# Patient Record
Sex: Female | Born: 2000 | Race: Black or African American | Hispanic: No | Marital: Single | Smoking: Never smoker
Health system: Southern US, Community
[De-identification: ages and names within clinical notes are randomized; demographics above are authoritative.]

## PROBLEM LIST (undated history)

## (undated) DIAGNOSIS — L309 Dermatitis, unspecified: Secondary | ICD-10-CM

## (undated) DIAGNOSIS — F99 Mental disorder, not otherwise specified: Secondary | ICD-10-CM

## (undated) HISTORY — DX: Mental disorder, not otherwise specified: F99

## (undated) HISTORY — DX: Dermatitis, unspecified: L30.9

---

## 2021-04-15 ENCOUNTER — Ambulatory Visit (INDEPENDENT_AMBULATORY_CARE_PROVIDER_SITE_OTHER): Payer: Federal, State, Local not specified - PPO | Admitting: Obstetrics & Gynecology

## 2021-04-15 ENCOUNTER — Encounter: Payer: Self-pay | Admitting: Obstetrics & Gynecology

## 2021-04-15 VITALS — BP 127/78 | HR 85 | Ht 62.0 in | Wt 207.0 lb

## 2021-04-15 DIAGNOSIS — N913 Primary oligomenorrhea: Secondary | ICD-10-CM

## 2021-04-15 NOTE — Patient Instructions (Addendum)
Ask Mom about the type of breast cancer? Is is hormone sensitive? Do you need genetic testing? Can you take hormonal pills/birth control pills? Have you had HPV vaccine/Gardasil?   Polycystic Ovary Syndrome Polycystic ovarian syndrome (PCOS) is a common hormonal disorder among women of reproductive age. In most women with PCOS, small fluid-filled sacs (cysts) grow on the ovaries. PCOS can cause problems with menstrual periods and make it hard to get and stay pregnant. If this condition is not treated, it can lead to serious health problems, such as diabetes and heart disease. What are the causes? The cause of this condition is not known. It may be due to certain factors, such as: Irregular menstrual cycle. High levels of certain hormones. Problems with the hormone that helps to control blood sugar (insulin). Certain genes. What increases the risk? You are more likely to develop this condition if you: Have a family history of PCOS or type 2 diabetes. Are overweight, eat unhealthy foods, and are not active. These factors may cause problems with blood sugar control, which can contribute to PCOS or PCOS symptoms. What are the signs or symptoms? Symptoms of this condition include: Ovarian cysts and sometimes pelvic pain. Menstrual periods that are not regular or are too heavy. Inability to get or stay pregnant. Increased growth of hair on the face, chest, stomach, back, thumbs, thighs, or toes. Acne or oily skin. Acne may develop during adulthood, and it may not get better with treatment. Weight gain or obesity. Patches of thickened and dark brown or black skin on the neck, arms, breasts, or thighs. How is this diagnosed? This condition is diagnosed based on: Your medical history. A physical exam that includes a pelvic exam. Your health care provider may look for areas of increased hair growth on your skin. Tests, such as: An ultrasound to check the ovaries for cysts and to view the lining  of the uterus. Blood tests to check levels of sugar (glucose), female hormone (testosterone), and female hormones (estrogen and progesterone). How is this treated? There is no cure for this condition, but treatment can help to manage symptoms and prevent more health problems from developing. Treatment varies depending on your symptoms and if you want to have a baby or if you need birth control. Treatment may include: Making nutrition and lifestyle changes. Taking the progesterone hormone to start a menstrual period. Taking birth control pills to help you have regular menstrual periods. Taking medicines such as: Medicines to make you ovulate, if you want to get pregnant. Medicine to reduce extra hair growth. Having surgery in severe cases. This may involve making small holes in one or both of your ovaries. This decreases the amount of testosterone that your body makes. Follow these instructions at home: Take over-the-counter and prescription medicines only as told by your health care provider. Follow a healthy meal plan that includes lean proteins, complex carbohydrates, fresh fruits and vegetables, low-fat dairy products, healthy fats, and fiber. If you are overweight, lose weight as told by your health care provider. Your health care provider can determine how much weight loss is best for you and can help you lose weight safely. Keep all follow-up visits. This is important. Contact a health care provider if: Your symptoms do not get better with medicine. Your symptoms get worse or you develop new symptoms. Summary Polycystic ovarian syndrome (PCOS) is a common hormonal disorder among women of reproductive age. PCOS can cause problems with menstrual periods and make it hard to get and  stay pregnant. If this condition is not treated, it can lead to serious health problems, such as diabetes and heart disease. There is no cure for this condition, but treatment can help to manage symptoms and  prevent more health problems from developing. This information is not intended to replace advice given to you by your health care provider. Make sure you discuss any questions you have with your health care provider. Document Revised: 11/14/2019 Document Reviewed: 11/14/2019 Elsevier Patient Education  2022 Elsevier Inc.   HPV (Human Papillomavirus) Vaccine: What You Need to Know 1. Why get vaccinated? HPV (human papillomavirus) vaccine can prevent infection with some types of human papillomavirus. HPV infections can cause certain types of cancers, including: cervical, vaginal, and vulvar cancers in women penile cancer in men anal cancers in both men and women cancers of tonsils, base of tongue, and back of throat (oropharyngeal cancer) in both men and women HPV infections can also cause anogenital warts. HPV vaccine can prevent over 90% of cancers caused by HPV. HPV is spread through intimate skin-to-skin or sexual contact. HPV infections are so common that nearly all people will get at least one type of HPV at some time in their lives. Most HPV infections go away on their own within 2 years. But sometimes HPV infections will last longer and can cause cancers later in life. 2. HPV vaccine HPV vaccine is routinely recommended for adolescents at 22 or 19 years of age to ensure they are protected before they are exposed to the virus. HPV vaccine may be given beginning at age 62 years and vaccination is recommended for everyone through 19 years of age. HPV vaccine may be given to adults 27 through 19 years of age, based on discussions between the patient and health care provider. Most children who get the first dose before 74 years of age need 2 doses of HPV vaccine. People who get the first dose at or after 75 years of age and younger people with certain immunocompromising conditions need 3 doses. Your health care provider can give you more information. HPV vaccine may be given at the same time as  other vaccines. 3. Talk with your health care provider Tell your vaccination provider if the person getting the vaccine: Has had an allergic reaction after a previous dose of HPV vaccine, or has any severe, life-threatening allergies Is pregnant--HPV vaccine is not recommended until after pregnancy In some cases, your health care provider may decide to postpone HPV vaccination until a future visit. People with minor illnesses, such as a cold, may be vaccinated. People who are moderately or severely ill should usually wait until they recover before getting HPV vaccine. Your health care provider can give you more information. 4. Risks of a vaccine reaction Soreness, redness, or swelling where the shot is given can happen after HPV vaccination. Fever or headache can happen after HPV vaccination. People sometimes faint after medical procedures, including vaccination. Tell your provider if you feel dizzy or have vision changes or ringing in the ears. As with any medicine, there is a very remote chance of a vaccine causing a severe allergic reaction, other serious injury, or death. 5. What if there is a serious problem? An allergic reaction could occur after the vaccinated person leaves the clinic. If you see signs of a severe allergic reaction (hives, swelling of the face and throat, difficulty breathing, a fast heartbeat, dizziness, or weakness), call 9-1-1 and get the person to the nearest hospital. For other signs that concern  you, call your health care provider. Adverse reactions should be reported to the Vaccine Adverse Event Reporting System (VAERS). Your health care provider will usually file this report, or you can do it yourself. Visit the VAERS website at www.vaers.LAgents.no or call 787-831-5194. VAERS is only for reporting reactions, and VAERS staff members do not give medical advice. 6. The National Vaccine Injury Compensation Program The [employer] Vaccine Injury Compensation Program (VICP)  is a federal program that was created to compensate people who may have been injured by certain vaccines. Claims regarding alleged injury or death due to vaccination have a time limit for filing, which may be as short as two years. Visit the VICP website at SpiritualWord.at or call (510) 450-1828 to learn about the program and about filing a claim. 7. How can I learn more? Ask your health care provider. Call your local or state health department. Visit the website of the Food and Drug Administration (FDA) for vaccine package inserts and additional information at FinderList.no. Contact the Centers for Disease Control and Prevention (CDC): Call 669-860-7883 (1-800-CDC-INFO) or Visit CDC's website at PicCapture.uy. Vaccine Information Statement HPV Vaccine (01/24/2020) This information is not intended to replace advice given to you by your health care provider. Make sure you discuss any questions you have with your health care provider. Document Revised: 03/03/2020 Document Reviewed: 03/03/2020 Elsevier Patient Education  2022 [employer].

## 2021-04-15 NOTE — Progress Notes (Signed)
   GYNECOLOGY OFFICE VISIT NOTE  History:   Stacey Montgomery is a 20 y.o. G0P0000 here today for evaluation of having 1-2 menstrual periods a year.  Reports menarche at age 87, always had irregular menstrual cycles skipping months but this got worse after age 26. Denies any acne, abnormal hair growth. Reports mother has a history of PCOS, also was diagnosed with breast cancer. She is unsure of what type, does not know what treatment she had.  Patient denies ever being sexually active. She denies any current abnormal vaginal discharge, bleeding, pelvic pain or other concerns.    History reviewed. No pertinent past medical history.  History reviewed. No pertinent surgical history.  The following portions of the patient's history were reviewed and updated as appropriate: allergies, current medications, past family history, past medical history, past social history, past surgical history and problem list.   Health Maintenance:  She is unsure if she received HPV vaccines.  Review of Systems:  Pertinent items noted in HPI and remainder of comprehensive ROS otherwise negative.  Physical Exam:  BP 127/78   Pulse 85   Ht 5\' 2"  (1.575 m)   Wt 207 lb (93.9 kg)   LMP 03/31/2021   BMI 37.86 kg/m  CONSTITUTIONAL: Well-developed, well-nourished female in no acute distress.  HEENT:  Normocephalic, atraumatic. External right and left ear normal. No scleral icterus.  NECK: Normal range of motion, supple, no masses noted on observation SKIN: No rash noted. Not diaphoretic. No erythema. No pallor. MUSCULOSKELETAL: Normal range of motion. No edema noted. NEUROLOGIC: Alert and oriented to person, place, and time. Normal muscle tone coordination. No cranial nerve deficit noted. PSYCHIATRIC: Normal mood and affect. Normal behavior. Normal judgment and thought content. CARDIOVASCULAR: Normal heart rate noted RESPIRATORY: Effort and breath sounds normal, no problems with respiration noted ABDOMEN: No masses  noted. No other overt distention noted.   PELVIC: Deferred    Assessment and Plan:     1. Primary oligomenorrhea Could be PCOS. Will check labs for this and for other potential etiologies.  Ultrasound also ordered. Will follow up results and manage accordingly. - Follicle stimulating hormone - Hemoglobin A1c - 05/31/2021 PELVIC COMPLETE WITH TRANSVAGINAL; Future - TSH - Prolactin - Testos,Total,Free and SHBG (Female) - 17-Hydroxyprogesterone - B-HCG Quant If patient has PCOS, discussed weight loss vs OCPs vs Metformin. Hesitant to do OCPs without further information about her mother's breast cancer type; leaning more towards Metformin for medical therapy. Will discuss at a future visit.  Routine preventative health maintenance measures emphasized. Please refer to After Visit Summary for other counseling recommendations.   Return for any gynecologic concerns.    I spent 20 minutes dedicated to the care of this patient including pre-visit review of records, face to face time with the patient discussing her conditions and treatments and post visit orders.    [place], MD, FACOG Obstetrician & Gynecologist, [facility] for [facility]

## 2021-04-16 LAB — FOLLICLE STIMULATING HORMONE: FSH: 5.1 m[IU]/mL

## 2021-05-03 ENCOUNTER — Other Ambulatory Visit: Payer: Federal, State, Local not specified - PPO

## 2021-05-04 ENCOUNTER — Encounter: Payer: Self-pay | Admitting: Obstetrics & Gynecology

## 2021-05-04 DIAGNOSIS — E282 Polycystic ovarian syndrome: Secondary | ICD-10-CM | POA: Insufficient documentation

## 2021-05-04 DIAGNOSIS — Z803 Family history of malignant neoplasm of breast: Secondary | ICD-10-CM | POA: Insufficient documentation

## 2021-05-06 ENCOUNTER — Other Ambulatory Visit: Payer: Self-pay

## 2021-05-06 ENCOUNTER — Other Ambulatory Visit: Payer: Self-pay | Admitting: Obstetrics & Gynecology

## 2021-05-06 ENCOUNTER — Ambulatory Visit (INDEPENDENT_AMBULATORY_CARE_PROVIDER_SITE_OTHER): Payer: Federal, State, Local not specified - PPO

## 2021-05-06 DIAGNOSIS — N913 Primary oligomenorrhea: Secondary | ICD-10-CM | POA: Diagnosis not present

## 2021-05-07 ENCOUNTER — Encounter: Payer: Self-pay | Admitting: Obstetrics & Gynecology

## 2021-05-07 LAB — 17-HYDROXYPROGESTERONE: 17-OH-Progesterone, LC/MS/MS: 42 ng/dL

## 2021-05-07 LAB — HCG, QUANTITATIVE, PREGNANCY: HCG, Total, QN: <3 m[IU]/mL

## 2021-05-07 LAB — TESTOS,TOTAL,FREE AND SHBG (FEMALE)
Free Testosterone: 7.9 pg/mL — ABNORMAL HIGH (ref 0.1–6.4)
Sex Hormone Binding: 17 nmol/L (ref 17–124)
Testosterone, Total, LC-MS-MS: 42 ng/dL (ref 2–45)

## 2021-05-07 LAB — PROLACTIN: Prolactin: 8.6 ng/mL

## 2021-05-07 LAB — TSH: TSH: 1.55 mIU/L

## 2021-05-20 ENCOUNTER — Ambulatory Visit: Payer: Federal, State, Local not specified - PPO | Admitting: Obstetrics and Gynecology

## 2021-06-16 NOTE — Progress Notes (Signed)
° °  GYNECOLOGY OFFICE VISIT NOTE  History:   Stacey Montgomery is a 20 y.o. G0P0000 here today for discussion regarding PCOS treatment. She is confirmed diagnosed by free T and oligomenorrhea.   Her cycles are very irregular, once went 11 months. Usually q6 months She has not tried anything yet.    Not yet sexually active.     Past Medical History:  Diagnosis Date   Eczema    Mental disorder     History reviewed. No pertinent surgical history.  The following portions of the patient's history were reviewed and updated as appropriate: allergies, current medications, past family history, past medical history, past social history, past surgical history and problem list.   Health Maintenance:   Not yet due   Review of Systems:  Pertinent items noted in HPI and remainder of comprehensive ROS otherwise negative.  Physical Exam:  BP 135/68    Pulse 84    Ht 5\' 2"  (1.575 m)    Wt 196 lb 1.9 oz (89 kg)    LMP 04/10/2021    BMI 35.87 kg/m  CONSTITUTIONAL: Well-developed, well-nourished female in no acute distress.  HEENT:  Normocephalic, atraumatic. External right and left ear normal. No scleral icterus.  NECK: Normal range of motion, supple, no masses noted on observation SKIN: No rash noted. Not diaphoretic. No erythema. No pallor. MUSCULOSKELETAL: Normal range of motion. No edema noted. NEUROLOGIC: Alert and oriented to person, place, and time. Normal muscle tone coordination. No cranial nerve deficit noted. PSYCHIATRIC: Normal mood and affect. Normal behavior. Normal judgment and thought content.  CARDIOVASCULAR: Normal heart rate noted RESPIRATORY: Effort and breath sounds normal, no problems with respiration noted ABDOMEN: No masses noted. No other overt distention noted.    PELVIC: Deferred  Labs and Imaging No results found for this or any previous visit (from the past 168 hour(s)). No results found.  Assessment and Plan:    1. PCOS (polycystic ovarian syndrome) - Discussed  diagnosis of PCOS and on what basis. Reviewed long term implication of issues with weight loss, metabolic syndrome, infertility - Labwork for FLP and HgBA1C: Likely had in the last month with PCP office - will request results.  - Reviewed possible help of Myoinositiol with weight loss - she has already lost 10 lbs intentionally since last appt. Offered referral to dietician. She Accepts.  - Discussed importance of endometrial protection with different hormonal methods to prevent hyperplasia/malignancy in the setting of oligomenorrhea.  - She would like to try: OCPs   Routine preventative health maintenance measures emphasized. Please refer to After Visit Summary for other counseling recommendations.   Return in about 6 months (around 12/16/2021) for Follow up - PCOS.  12/18/2021, MD, FACOG Obstetrician & Gynecologist, [facility] for [facility], [facility]

## 2021-06-17 ENCOUNTER — Ambulatory Visit: Payer: Federal, State, Local not specified - PPO | Admitting: Obstetrics and Gynecology

## 2021-06-17 ENCOUNTER — Telehealth: Payer: Self-pay | Admitting: *Deleted

## 2021-06-17 ENCOUNTER — Other Ambulatory Visit: Payer: Self-pay

## 2021-06-17 ENCOUNTER — Encounter: Payer: Self-pay | Admitting: Obstetrics and Gynecology

## 2021-06-17 VITALS — BP 135/68 | HR 84 | Ht 62.0 in | Wt 196.1 lb

## 2021-06-17 DIAGNOSIS — E282 Polycystic ovarian syndrome: Secondary | ICD-10-CM | POA: Diagnosis not present

## 2021-06-17 MED ORDER — NORETHIN ACE-ETH ESTRAD-FE 1-20 MG-MCG PO TABS: 1.0000 | ORAL_TABLET | Freq: Every day | ORAL | 3 refills | Status: DC

## 2021-06-17 NOTE — Telephone Encounter (Signed)
Left patient a message to call and let the office know what [facility] office she was seen at for lab work.

## 2021-06-17 NOTE — Patient Instructions (Signed)
Supplements: Myoinositol, D-chiroinositol

## 2021-07-10 ENCOUNTER — Encounter: Payer: Self-pay | Admitting: Obstetrics and Gynecology

## 2021-07-23 ENCOUNTER — Encounter: Payer: Federal, State, Local not specified - PPO | Admitting: Registered"

## 2021-07-28 ENCOUNTER — Other Ambulatory Visit: Payer: Self-pay | Admitting: General Practice

## 2021-07-28 DIAGNOSIS — Z713 Dietary counseling and surveillance: Secondary | ICD-10-CM

## 2021-07-28 DIAGNOSIS — E282 Polycystic ovarian syndrome: Secondary | ICD-10-CM

## 2021-07-29 ENCOUNTER — Other Ambulatory Visit: Payer: Federal, State, Local not specified - PPO

## 2021-08-12 ENCOUNTER — Other Ambulatory Visit: Payer: Federal, State, Local not specified - PPO

## 2021-09-02 ENCOUNTER — Other Ambulatory Visit: Payer: Self-pay | Admitting: *Deleted

## 2021-09-02 MED ORDER — NORETHIN ACE-ETH ESTRAD-FE 1-20 MG-MCG PO TABS: 1.0000 | ORAL_TABLET | Freq: Every day | ORAL | 1 refills | Status: DC

## 2022-01-14 NOTE — Progress Notes (Signed)
Pt did not answer call for virtual appointment.   Milas Hock, MD Attending Obstetrician & Gynecologist, [facility] for [facility], [facility]

## 2022-01-20 ENCOUNTER — Telehealth (INDEPENDENT_AMBULATORY_CARE_PROVIDER_SITE_OTHER): Payer: Federal, State, Local not specified - PPO | Admitting: Obstetrics and Gynecology

## 2022-01-20 ENCOUNTER — Telehealth: Payer: Self-pay

## 2022-01-20 DIAGNOSIS — Z91199 Patient's noncompliance with other medical treatment and regimen due to unspecified reason: Secondary | ICD-10-CM

## 2022-01-20 DIAGNOSIS — E282 Polycystic ovarian syndrome: Secondary | ICD-10-CM

## 2022-01-20 NOTE — Telephone Encounter (Signed)
Attempted to call pt to begin virtual visit. Pt did not answer. VM left asking pt to call office to either reschedule appt or to begin visit.

## 2022-02-18 ENCOUNTER — Other Ambulatory Visit: Payer: Self-pay | Admitting: Obstetrics and Gynecology

## 2022-02-18 DIAGNOSIS — E282 Polycystic ovarian syndrome: Secondary | ICD-10-CM

## 2022-12-09 IMAGING — US US PELVIS COMPLETE
1 series · 14 of 25 positions shown · non-contrast
Comparison: None.

CLINICAL DATA: Oligomenorrhea.  Concern for PCOS.

EXAM:
TRANSABDOMINAL ULTRASOUND OF PELVIS
TECHNIQUE: Transabdominal ultrasound examination of the pelvis was performed
including evaluation of the uterus, ovaries, adnexal regions, and
pelvic cul-de-sac.

[Series 1: us pelvic complete with transvaginal · 46 acquisitions, 14 frames shown]
[im 1/46]
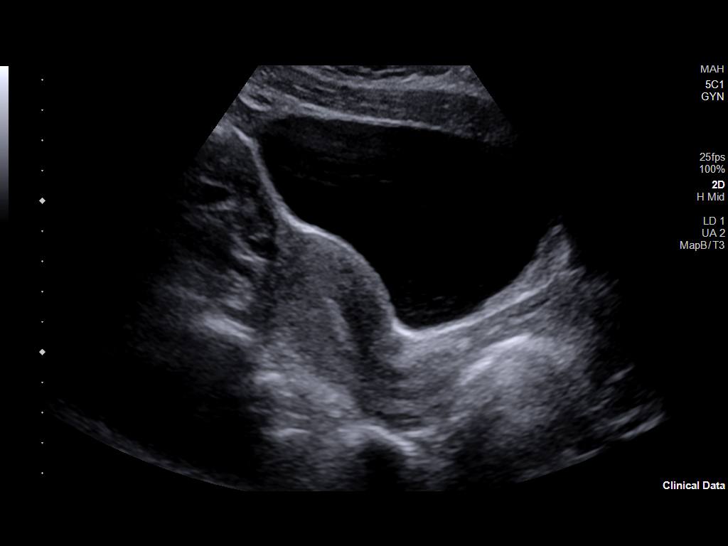
[im 4/46]
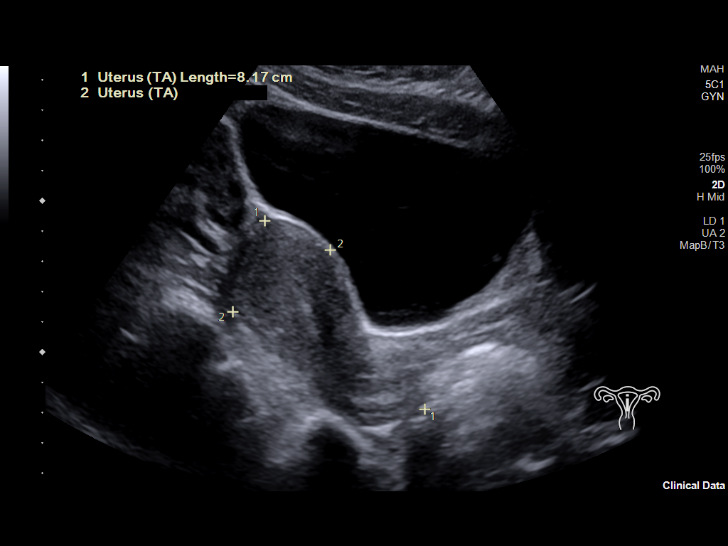
[im 8/46]
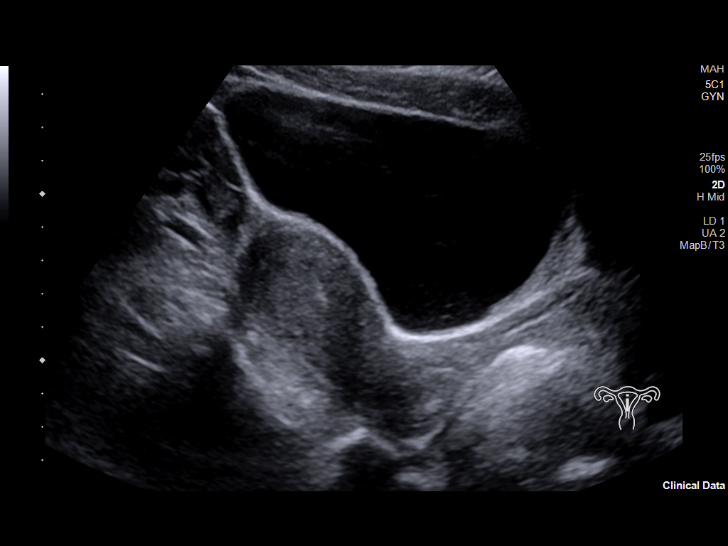
[im 12/46]
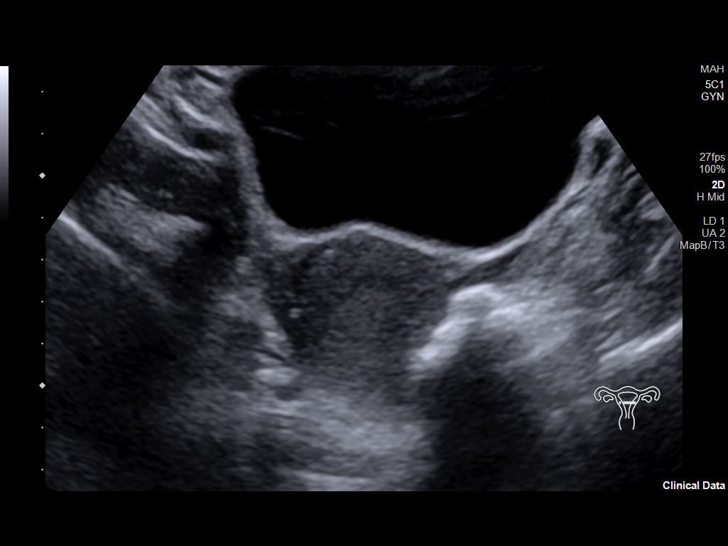
[im 16/46]
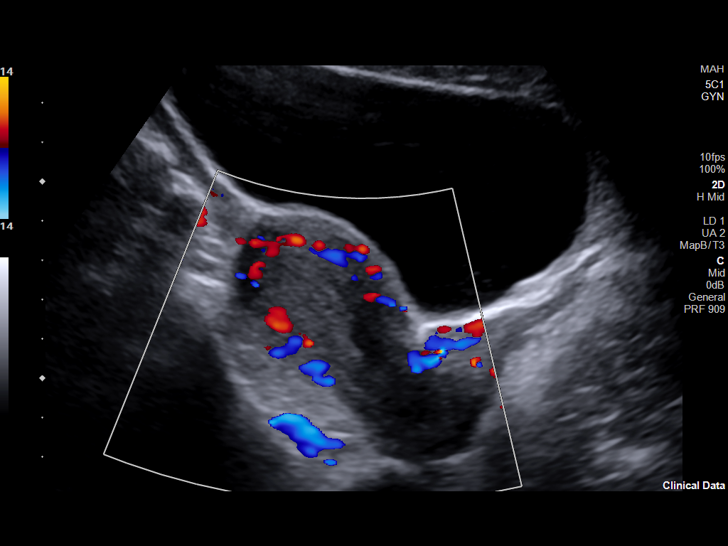
[im 17/46]
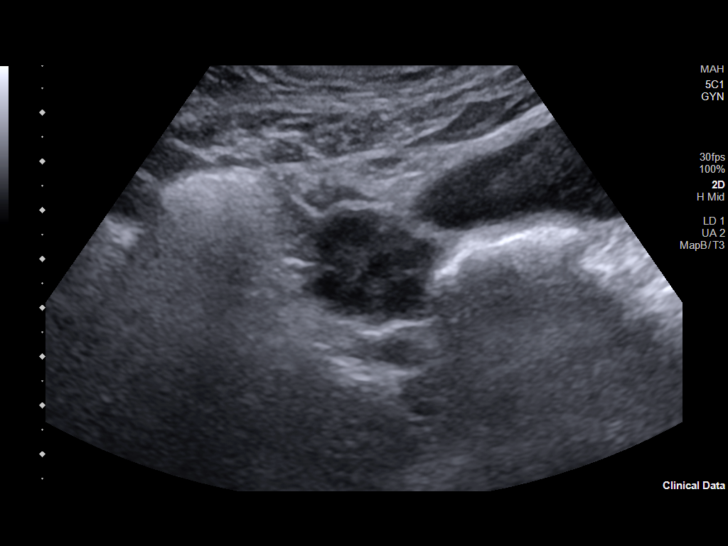
[im 21/46]
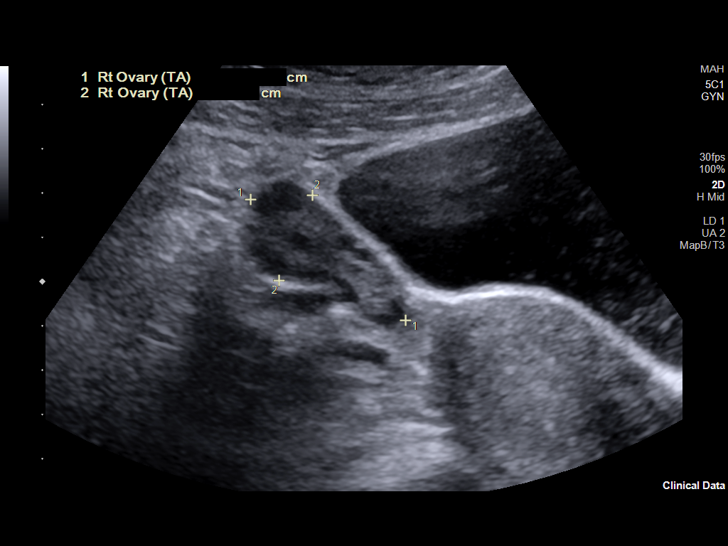
[im 25/46]
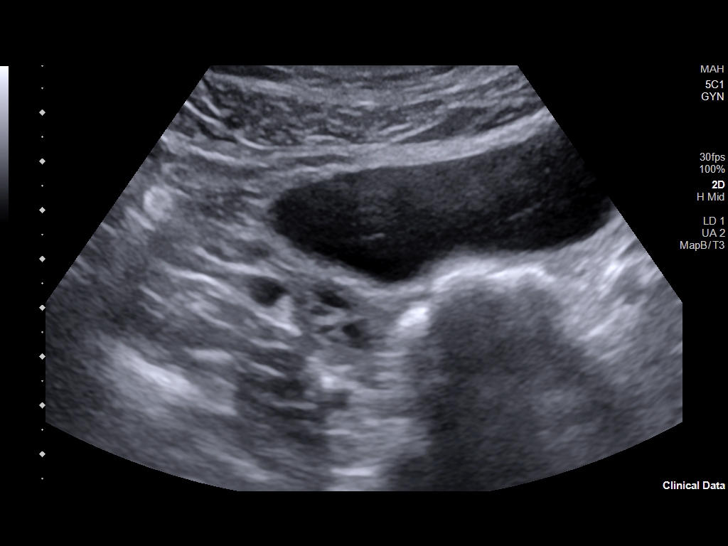
[im 29/46]
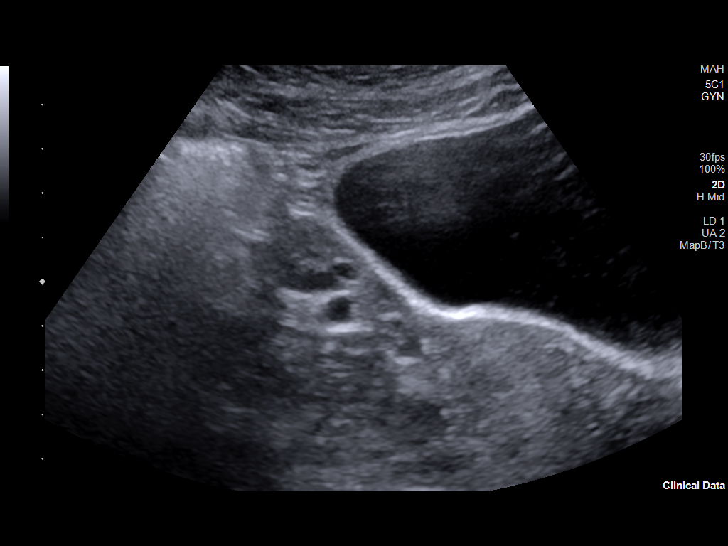
[im 31/46]
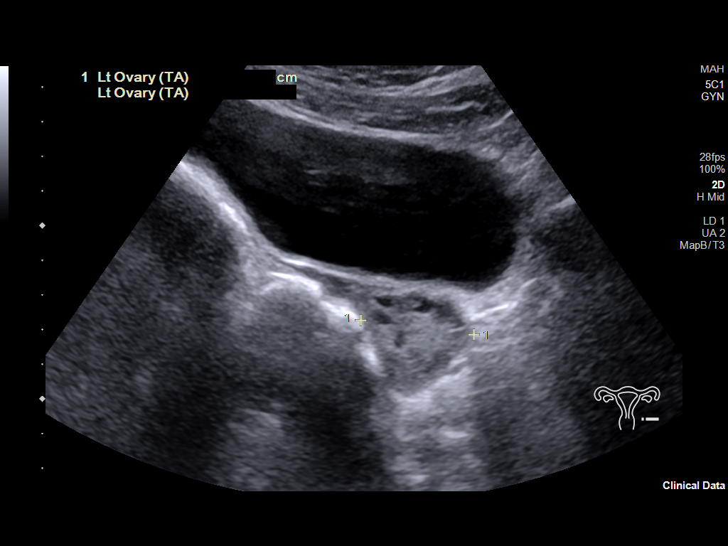
[im 34/46]
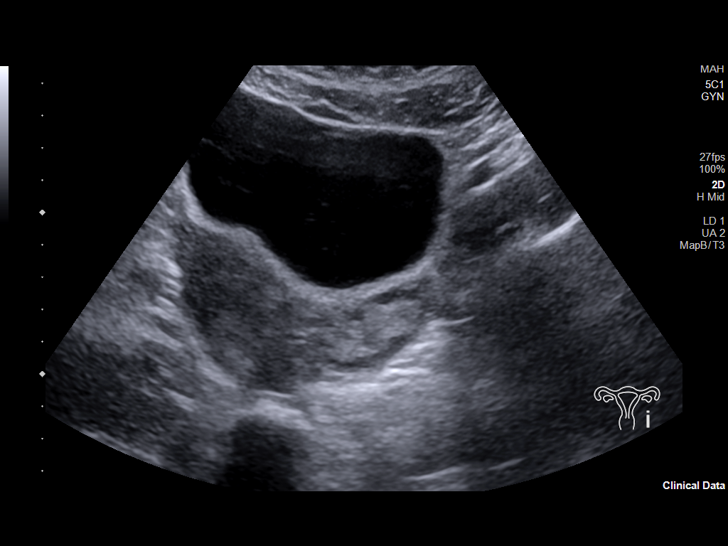
[im 38/46]
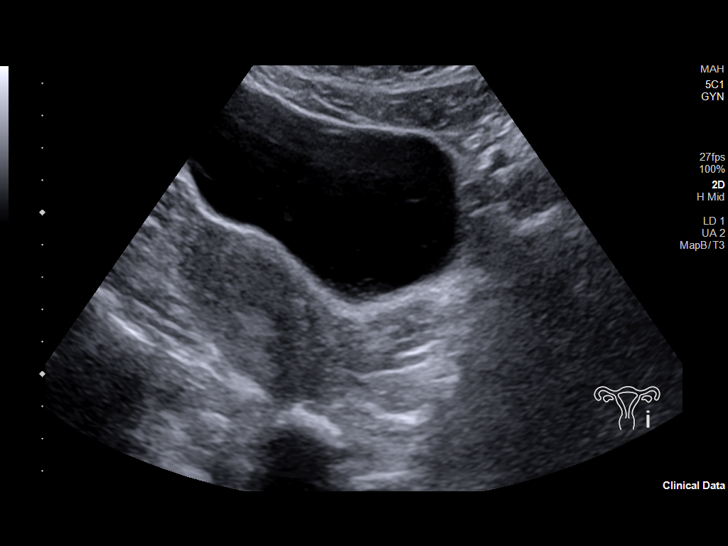
[im 42/46]
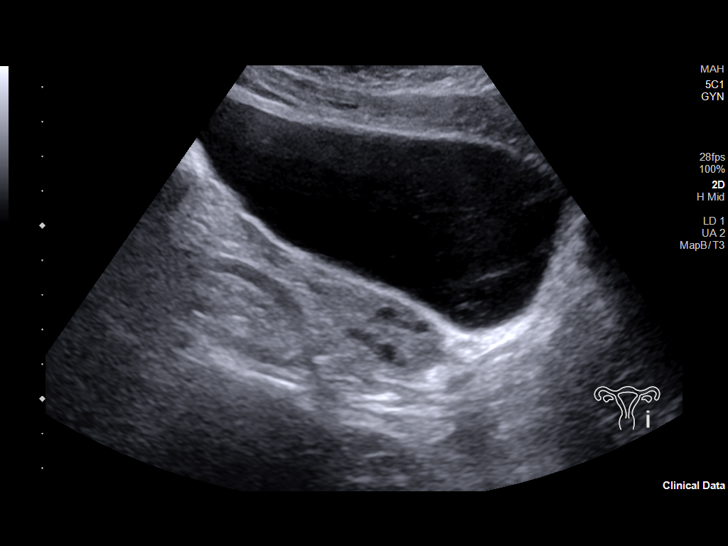
[im 46/46]
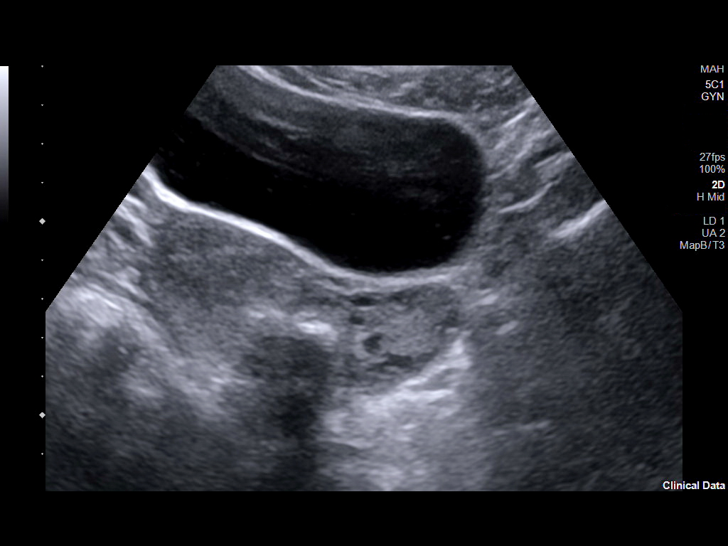

[14 of 25 positions shown; findings below may reference images not displayed]

FINDINGS: Uterus

Measurements: 8.2 x 3.8 x 4.5 cm = volume: 73 mL. No fibroids or
other mass visualized.

Endometrium

Thickness: 9 mm.  No focal abnormality visualized.

Right ovary

Measurements: 4.4 x 2.1 x 2.5 cm = volume: 11.8 mL. Normal
appearance/no adnexal mass.

Left ovary

Measurements: 4.6 x 2.1 x 3.3 cm = volume: 16.7 mL. Normal
appearance/no adnexal mass.

Other findings:  No abnormal free fluid.
IMPRESSION: 1. The ovaries are mildly enlarged bilaterally. Detailed evaluation
of ovarian parenchyma is limited secondary to transabdominal
scanning only. Enlarged ovaries can be seen in the setting of PCOS.

## 2023-01-03 ENCOUNTER — Telehealth: Payer: Self-pay | Admitting: *Deleted

## 2023-01-03 NOTE — Telephone Encounter (Signed)
Returned call from 01/02/2023 at 2:59 PM. Left patient a message to call to reschedule.

## 2023-01-30 ENCOUNTER — Ambulatory Visit: Payer: Federal, State, Local not specified - PPO | Admitting: Obstetrics and Gynecology

## 2023-02-14 NOTE — Progress Notes (Unsigned)
   GYNECOLOGY OFFICE VISIT NOTE  History:   Stacey Montgomery is a 21 y.o. G0P0000 here today for PCOS follow up.   - Diagnosed by free T and oligomenorrhea.  - She was on OCPs for endometrial protection.   She had been on the OCPs for a couple months but ultimately stopped them because she felt like they were affecting her mood.  Since stopping them she has had a little bit of bleeding in June but she has not had a spontaneous period.   The following portions of the patient's history were reviewed and updated as appropriate: allergies, current medications, past family history, past medical history, past social history, past surgical history and problem list.  Review of Systems:  Pertinent items noted in HPI and remainder of comprehensive ROS otherwise negative.  Physical Exam:  BP 129/84   Pulse 73   Ht 5\' 2"  (1.575 m)   Wt 223 lb (101.2 kg)   LMP 11/21/2022   BMI 40.79 kg/m  CONSTITUTIONAL: Well-developed, well-nourished female in no acute distress.  HEENT:  Normocephalic, atraumatic. External right and left ear normal. No scleral icterus.  NECK: Normal range of motion, supple, no masses noted on observation SKIN: No rash noted. Not diaphoretic. No erythema. No pallor. MUSCULOSKELETAL: Normal range of motion. No edema noted. NEUROLOGIC: Alert and oriented to person, place, and time. Normal muscle tone coordination. No cranial nerve deficit noted. PSYCHIATRIC: Normal mood and affect. Normal behavior. Normal judgment and thought content.  PELVIC: Deferred  Labs and Imaging No results found for this or any previous visit (from the past 168 hour(s)). No results found.  Assessment and Plan:   1. PCOS (polycystic ovarian syndrome) - We discussed starting myo-inositol as a potential aid to help with weight loss.  We discussed that weight loss may help with resumption of normal menstrual cycles and therefore keep her off medication.  We reviewed that myo-inositol does not work for all  patients as it depends on whether or not you have the lack of a specific enzyme.  The only way to figure this out is by trial and error.  She will start the myo-inositol. -We discussed if the myo-inositol is ineffective for her then I would recommend starting metformin 500 mg nightly.  She could taper up to 1000 mg nightly and potentially to twice daily if it was proving effective. -In the interim I would recommend Provera every 2 to 3 months for amenorrhea for endometrial protection. -We discussed follow-up in 4 months and annual as well at that time.   Meds ordered this encounter  Medications   medroxyPROGESTERone (PROVERA) 10 MG tablet    Sig: Take 1 tablet (10 mg total) by mouth daily.    Dispense:  7 tablet    Refill:  3     Routine preventative health maintenance measures emphasized. Please refer to After Visit Summary for other counseling recommendations.   Return in about 6 months (around 08/18/2023) for annual.  Milas Hock, MD, FACOG Obstetrician & Gynecologist, [facility] for [facility], [facility]

## 2023-02-15 ENCOUNTER — Ambulatory Visit: Payer: Federal, State, Local not specified - PPO | Admitting: Obstetrics and Gynecology

## 2023-02-15 ENCOUNTER — Encounter: Payer: Self-pay | Admitting: Obstetrics and Gynecology

## 2023-02-15 VITALS — BP 129/84 | HR 73 | Ht 62.0 in | Wt 223.0 lb

## 2023-02-15 DIAGNOSIS — E282 Polycystic ovarian syndrome: Secondary | ICD-10-CM | POA: Diagnosis not present

## 2023-02-15 MED ORDER — MEDROXYPROGESTERONE ACETATE 10 MG PO TABS: 10.0000 mg | ORAL_TABLET | Freq: Every day | ORAL | 3 refills | Status: AC

## 2023-05-05 ENCOUNTER — Encounter: Payer: Self-pay | Admitting: Obstetrics and Gynecology

## 2023-05-05 DIAGNOSIS — E282 Polycystic ovarian syndrome: Secondary | ICD-10-CM

## 2023-05-08 MED ORDER — METFORMIN HCL 500 MG PO TABS: 500.0000 mg | ORAL_TABLET | Freq: Two times a day (BID) | ORAL | 5 refills | Status: AC

## 2023-05-08 NOTE — Addendum Note (Signed)
Addended by: Milas Hock A on: 05/08/2023 09:08 AM   Modules accepted: Orders

## 2023-07-24 ENCOUNTER — Telehealth: Payer: Self-pay | Admitting: *Deleted

## 2023-07-24 NOTE — Telephone Encounter (Signed)
Left patient a message to call and schedule annual. 

## 2024-03-02 ENCOUNTER — Other Ambulatory Visit: Payer: Self-pay | Admitting: Obstetrics and Gynecology

## 2024-03-02 DIAGNOSIS — E282 Polycystic ovarian syndrome: Secondary | ICD-10-CM

## 2024-04-25 ENCOUNTER — Ambulatory Visit: Admitting: Obstetrics and Gynecology

## 2024-04-25 ENCOUNTER — Encounter: Payer: Self-pay | Admitting: Obstetrics and Gynecology

## 2024-04-25 VITALS — BP 120/81 | HR 74 | Ht 62.0 in | Wt 231.0 lb

## 2024-04-25 DIAGNOSIS — N63 Unspecified lump in unspecified breast: Secondary | ICD-10-CM | POA: Diagnosis not present

## 2024-04-25 DIAGNOSIS — N6452 Nipple discharge: Secondary | ICD-10-CM

## 2024-04-25 DIAGNOSIS — B372 Candidiasis of skin and nail: Secondary | ICD-10-CM | POA: Diagnosis not present

## 2024-04-25 MED ORDER — NYSTATIN 100000 UNIT/GM EX POWD: 1.0000 | Freq: Two times a day (BID) | CUTANEOUS | 1 refills | Status: AC | PRN

## 2024-04-25 NOTE — Addendum Note (Signed)
 Addended by: CLEATUS MOCCASIN A on: 04/25/2024 04:07 PM   Modules accepted: Orders

## 2024-04-25 NOTE — Progress Notes (Addendum)
   GYNECOLOGY OFFICE VISIT NOTE  History:  Stacey Montgomery is a 23 y.o. G0P0000 here today for breast concern.   Taking Prozac from counselor.   Lumps in both breasts, with flaky skin and some discoloration. Flaking X 6mos but lumps X 3mos. Number of lumps increasing. Last time she felt a lump was a month ago. Her mom had thought she had breast cancer but it turned out to be breast cysts.   Denies nipple discharge but then noted some nipple discharge when scheduling.   She denies any abnormal vaginal discharge, bleeding, pelvic pain or other concerns.    Past Medical History:  Diagnosis Date   Eczema    Mental disorder     No past surgical history on file.  The following portions of the patient's history were reviewed and updated as appropriate: allergies, current medications, past family history, past medical history, past social history, past surgical history and problem list.   Review of Systems:  Pertinent items noted in HPI and remainder of comprehensive ROS otherwise negative.  Physical Exam:  BP 120/81   Pulse 74   Ht 5' 2 (1.575 m)   Wt 231 lb (104.8 kg)   LMP 01/19/2024   BMI 42.25 kg/m  CONSTITUTIONAL: Well-developed, well-nourished female in no acute distress.  HEENT:  Normocephalic, atraumatic. External right and left ear normal. No scleral icterus.  NECK: Normal range of motion, supple, no masses noted on observation SKIN: No rash noted. Not diaphoretic. No erythema. No pallor. MUSCULOSKELETAL: Normal range of motion. No edema noted. NEUROLOGIC: Alert and oriented to person, place, and time. Normal muscle tone coordination. No cranial nerve deficit noted. PSYCHIATRIC: Normal mood and affect. Normal behavior. Normal judgment and thought content.  Breasts: breasts appear normal, no suspicious masses, no nipple changes or axillary nodes. Under her breast there is some small bumps similar to her    Labs and Imaging No results found for this or any previous  visit (from the past week). No results found.  Assessment and Plan:  1. Benign breast lumps (Primary) She denies feeling anything recently. I feel nothing on exam. Offered breast US . She would like to wait and will call if she changes her mind. She will also call if new lumps or growing lumps or persistent. (After she checked out, she returned and decided she would like to proceed with breast US  - orders placed and she will call tomorrow).   2. Breast discharge - Prolactin  3. Yeast infection of the skin May also be eczema. She will try her usual eczema treatment and if ineffective, she will try the powder.  - nystatin powder; Apply 1 Application topically 2 (two) times daily as needed.  Dispense: 60 g; Refill: 1    Meds ordered this encounter  Medications   nystatin powder    Sig: Apply 1 Application topically 2 (two) times daily as needed.    Dispense:  60 g    Refill:  1     Routine preventative health maintenance measures emphasized. Please refer to After Visit Summary for other counseling recommendations.   Return for annual.  Vina Solian, MD, FACOG Obstetrician & Gynecologist, [facility] for [employer], [facility]

## 2024-05-22 ENCOUNTER — Telehealth: Payer: Self-pay | Admitting: *Deleted

## 2024-05-22 NOTE — Telephone Encounter (Signed)
Left patient a message to call and schedule. 

## 2024-05-23 ENCOUNTER — Ambulatory Visit: Admitting: Obstetrics and Gynecology

## 2024-05-26 ENCOUNTER — Other Ambulatory Visit: Payer: Self-pay | Admitting: Obstetrics and Gynecology
